# Patient Record
Sex: Female | Born: 1990 | Race: White | Hispanic: No | Marital: Single | Smoking: Never smoker
Health system: Southern US, Community
[De-identification: ages and names within clinical notes are randomized; demographics above are authoritative.]

---
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
## 2012-01-27 ENCOUNTER — Ambulatory Visit (INDEPENDENT_AMBULATORY_CARE_PROVIDER_SITE_OTHER): Payer: BC Managed Care – PPO | Admitting: Family Medicine

## 2012-01-27 ENCOUNTER — Encounter: Payer: Self-pay | Admitting: Family Medicine

## 2012-01-27 VITALS — BP 100/74 | HR 71 | Temp 98.7°F | Wt 147.0 lb

## 2012-01-27 DIAGNOSIS — F418 Other specified anxiety disorders: Secondary | ICD-10-CM

## 2012-01-27 DIAGNOSIS — F430 Acute stress reaction: Secondary | ICD-10-CM

## 2012-01-27 DIAGNOSIS — F341 Dysthymic disorder: Secondary | ICD-10-CM

## 2012-01-27 DIAGNOSIS — F988 Other specified behavioral and emotional disorders with onset usually occurring in childhood and adolescence: Secondary | ICD-10-CM

## 2012-01-27 DIAGNOSIS — F438 Other reactions to severe stress: Secondary | ICD-10-CM

## 2012-01-27 MED ORDER — ESCITALOPRAM OXALATE 10 MG PO TABS: 10.0000 mg | ORAL_TABLET | Freq: Every day | ORAL | Status: DC

## 2012-01-27 NOTE — Progress Notes (Signed)
  Subjective:    Patient ID: Krystal Hayden, female    DOB: Jul 12, 1990, 21 y.o.   MRN: [id number]  HPI Pt here c/o increased stress at school.      Review of Systems As above    Objective:   Physical Exam  Constitutional: She is oriented to person, place, and time. She appears well-developed and well-nourished.  Neurological: She is alert and oriented to person, place, and time.  Psychiatric: She has a normal mood and affect. Her behavior is normal. Judgment and thought content normal.       Add self report---? ADD Pt teary eyed with talking about stress and her roommate.  She is not suicidal          Assessment & Plan:

## 2012-01-27 NOTE — Assessment & Plan Note (Signed)
lexapro 10 mg  1 po qd Refer to [facility] rto 1 month or sooner prn

## 2012-01-27 NOTE — Patient Instructions (Signed)
Attention Deficit Hyperactivity Disorder Attention deficit hyperactivity disorder (ADHD) is a problem with behavior issues based on the way the brain functions (neurobehavioral disorder). It is a common reason for behavior and academic problems in school. CAUSES  The cause of ADHD is unknown in most cases. It may run in families. It sometimes can be associated with learning disabilities and other behavioral problems. SYMPTOMS  There are 3 types of ADHD. The 3 types and some of the symptoms include:  Inattentive   Gets bored or distracted easily.   Loses or forgets things. Forgets to hand in homework.   Has trouble organizing or completing tasks.   Difficulty staying on task.   An inability to organize daily tasks and school work.   Leaving projects, chores, or homework unfinished.   Trouble paying attention or responding to details. Careless mistakes.   Difficulty following directions. Often seems like is not listening.   Dislikes activities that require sustained attention (like chores or homework).   Hyperactive-impulsive   Feels like it is impossible to sit still or stay in a seat. Fidgeting with hands and feet.   Trouble waiting turn.   Talking too much or out of turn. Interruptive.   Speaks or acts impulsively.   Aggressive, disruptive behavior.   Constantly busy or on the go, noisy.   Combined   Has symptoms of both of the above.  Often children with ADHD feel discouraged about themselves and with school. They often perform well below their abilities in school. These symptoms can cause problems in home, school, and in relationships with peers. As children get older, the excess motor activities can calm down, but the problems with paying attention and staying organized persist. Most children do not outgrow ADHD but with good treatment can learn to cope with the symptoms. DIAGNOSIS  When ADHD is suspected, the diagnosis should be made by professionals trained in  ADHD.  Diagnosis will include:  Ruling out other reasons for the child's behavior.   The caregivers will check with the child's school and check their medical records.   They will talk to teachers and parents.   Behavior rating scales for the child will be filled out by those dealing with the child on a daily basis.  A diagnosis is made only after all information has been considered. TREATMENT  Treatment usually includes behavioral treatment often along with medicines. It may include stimulant medicines. The stimulant medicines decrease impulsivity and hyperactivity and increase attention. Other medicines used include antidepressants and certain blood pressure medicines. Most experts agree that treatment for ADHD should address all aspects of the child's functioning. Treatment should not be limited to the use of medicines alone. Treatment should include structured classroom management. The parents must receive education to address rewarding good behavior, discipline, and limit-setting. Tutoring or behavioral therapy or both should be available for the child. If untreated, the disorder can have long-term serious effects into adolescence and adulthood. HOME CARE INSTRUCTIONS   Often with ADHD there is a lot of frustration among the family in dealing with the illness. There is often blame and anger that is not warranted. This is a life long illness. There is no way to prevent ADHD. In many cases, because the problem affects the family as a whole, the entire family may need help. A therapist can help the family find better ways to handle the disruptive behaviors and promote change. If the child is young, most of the therapist's work is with the parents. Parents will   learn techniques for coping with and improving their child's behavior. Sometimes only the child with the ADHD needs counseling. Your caregivers can help you make these decisions.   Children with ADHD may need help in organizing. Some  helpful tips include:   Keep routines the same every day from wake-up time to bedtime. Schedule everything. This includes homework and playtime. This should include outdoor and indoor recreation. Keep the schedule on the refrigerator or a bulletin board where it is frequently seen. Mark schedule changes as far in advance as possible.   Have a place for everything and keep everything in its place. This includes clothing, backpacks, and school supplies.   Encourage writing down assignments and bringing home needed books.   Offer your child a well-balanced diet. Breakfast is especially important for school performance. Children should avoid drinks with caffeine including:   Soft drinks.   Coffee.   Tea.   However, some older children (adolescents) may find these drinks helpful in improving their attention.   Children with ADHD need consistent rules that they can understand and follow. If rules are followed, give small rewards. Children with ADHD often receive, and expect, criticism. Look for good behavior and praise it. Set realistic goals. Give clear instructions. Look for activities that can foster success and self-esteem. Make time for pleasant activities with your child. Give lots of affection.   Parents are their children's greatest advocates. Learn as much as possible about ADHD. This helps you become a stronger and better advocate for your child. It also helps you educate your child's teachers and instructors if they feel inadequate in these areas. Parent support groups are often helpful. A national group with local chapters is called CHADD (Children and Adults with Attention Deficit Hyperactivity Disorder).  PROGNOSIS  There is no cure for ADHD. Children with the disorder seldom outgrow it. Many find adaptive ways to accommodate the ADHD as they mature. SEEK MEDICAL CARE IF:  Your child has repeated muscle twitches, cough or speech outbursts.   Your child has sleep problems.   Your  child has a marked loss of appetite.   Your child develops depression.   Your child has new or worsening behavioral problems.   Your child develops dizziness.   Your child has a racing heart.   Your child has stomach pains.   Your child develops headaches.  Document Released: 04/05/2002 Document Revised: 04/04/2011 Document Reviewed: 11/16/2007 ExitCare Patient Information 2012 [employer]. 

## 2012-05-25 ENCOUNTER — Telehealth: Payer: Self-pay | Admitting: Family Medicine

## 2012-05-25 MED ORDER — LEVONORGEST-ETH ESTRAD 91-DAY 0.15-0.03 &0.01 MG PO TABS: 1.0000 | ORAL_TABLET | Freq: Every day | ORAL | Status: DC

## 2012-05-25 NOTE — Telephone Encounter (Signed)
Refill: Camrese 0.15-0.03-0.01 mg tab. Take 1 tablet by mouth daily. Qty 91. Last fill 02-24-12

## 2012-08-05 ENCOUNTER — Telehealth: Payer: Self-pay

## 2012-08-05 NOTE — Telephone Encounter (Signed)
Call to the patient, She needs Varicella titers to complete the forms for college. Msg left to call the office    KP

## 2012-08-10 NOTE — Telephone Encounter (Signed)
msg left to call the office     KP 
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
## 2012-08-15 ENCOUNTER — Other Ambulatory Visit: Payer: Self-pay | Admitting: Family Medicine

## 2012-08-25 NOTE — Telephone Encounter (Signed)
Letter mailed and copy of CPE form sent to be scanned and copy given back to Amy.     KP

## 2012-12-01 ENCOUNTER — Other Ambulatory Visit: Payer: Self-pay | Admitting: Family Medicine

## 2012-12-07 ENCOUNTER — Other Ambulatory Visit (HOSPITAL_COMMUNITY)
Admission: RE | Admit: 2012-12-07 | Discharge: 2012-12-07 | Disposition: A | Discharge status: Home / Self Care | Payer: BC Managed Care – PPO | Source: Ambulatory Visit | Attending: Family Medicine | Admitting: Family Medicine

## 2012-12-07 ENCOUNTER — Encounter: Payer: Self-pay | Admitting: Family Medicine

## 2012-12-07 ENCOUNTER — Ambulatory Visit (INDEPENDENT_AMBULATORY_CARE_PROVIDER_SITE_OTHER): Payer: BC Managed Care – PPO | Admitting: Family Medicine

## 2012-12-07 VITALS — BP 104/62 | HR 71 | Temp 98.8°F | Wt 146.8 lb

## 2012-12-07 DIAGNOSIS — Z124 Encounter for screening for malignant neoplasm of cervix: Secondary | ICD-10-CM | POA: Insufficient documentation

## 2012-12-07 DIAGNOSIS — N730 Acute parametritis and pelvic cellulitis: Secondary | ICD-10-CM

## 2012-12-07 DIAGNOSIS — Z113 Encounter for screening for infections with a predominantly sexual mode of transmission: Secondary | ICD-10-CM | POA: Insufficient documentation

## 2012-12-07 DIAGNOSIS — IMO0001 Reserved for inherently not codable concepts without codable children: Secondary | ICD-10-CM | POA: Insufficient documentation

## 2012-12-07 DIAGNOSIS — Z309 Encounter for contraceptive management, unspecified: Secondary | ICD-10-CM

## 2012-12-07 DIAGNOSIS — N760 Acute vaginitis: Secondary | ICD-10-CM | POA: Insufficient documentation

## 2012-12-07 DIAGNOSIS — Z1151 Encounter for screening for human papillomavirus (HPV): Secondary | ICD-10-CM | POA: Insufficient documentation

## 2012-12-07 MED ORDER — METRONIDAZOLE 500 MG PO TABS: ORAL_TABLET | ORAL | Status: DC

## 2012-12-07 MED ORDER — LEVONORGEST-ETH ESTRAD 91-DAY 0.15-0.03 &0.01 MG PO TABS: ORAL_TABLET | ORAL | Status: AC

## 2012-12-07 MED ORDER — AZITHROMYCIN 250 MG PO TABS: ORAL_TABLET | ORAL | Status: DC

## 2012-12-07 MED ORDER — CEFTRIAXONE SODIUM 500 MG IJ SOLR
500.0000 mg | Freq: Once | INTRAMUSCULAR | Status: AC
  Administered 2012-12-07: 500 mg via INTRAMUSCULAR

## 2012-12-07 NOTE — Patient Instructions (Signed)
Pelvic Inflammatory Disease °Pelvic inflammatory disease (PID) refers to an infection in some or all of the female organs. The infection can be in the uterus, ovaries, fallopian tubes, or the surrounding tissues in the pelvis. PID can cause abdominal or pelvic pain that comes on suddenly (acute pelvic pain). PID is a serious infection because it can lead to lasting (chronic) pelvic pain or the inability to have children (infertile).  °CAUSES  °The infection is often caused by the normal bacteria found in the vaginal tissues. PID may also be caused by an infection that is spread during sexual contact. PID can also occur following:  °· The birth of a baby.   °· A miscarriage.   °· An abortion.   °· Major pelvic surgery.   °· The use of an intrauterine device (IUD).   °· A sexual assault.   °RISK FACTORS °Certain factors can put a person at higher risk for PID, such as: °· Being younger than 25 years. °· Being sexually active at a young age. °· Using nonbarrier contraception. °· Having multiple sexual partners. °· Having sex with someone who has symptoms of a genital infection. °· Using oral contraception. °Other times, certain behaviors can increase the possibility of getting PID, such as: °· Having sex during your period. °· Using a vaginal douche. °· Having an intrauterine device (IUD) in place. °SYMPTOMS  °· Abdominal or pelvic pain.   °· Fever.   °· Chills.   °· Abnormal vaginal discharge. °· Abnormal uterine bleeding.   °· Unusual pain shortly after finishing your period. °DIAGNOSIS  °Your caregiver will choose some of the following methods to make a diagnosis, such as:  °· Performing a physical exam and history. A pelvic exam typically reveals a very tender uterus and surrounding pelvis.   °· Ordering laboratory tests including a pregnancy test, blood tests, and urine test.  °· Ordering cultures of the vagina and cervix to check for a sexually transmitted infection (STI). °· Performing an ultrasound.    °· Performing a laparoscopic procedure to look inside the pelvis.   °TREATMENT  °· Antibiotic medicines may be prescribed and taken by mouth.   °· Sexual partners may be treated when the infection is caused by a sexually transmitted disease (STD).   °· Hospitalization may be needed to give antibiotics intravenously. °· Surgery may be needed, but this is rare. °It may take weeks until you are completely well. If you are diagnosed with PID, you should also be checked for human immunodeficiency virus (HIV).   °HOME CARE INSTRUCTIONS  °· If given, take your antibiotics as directed. Finish the medicine even if you start to feel better.   °· Only take over-the-counter or prescription medicines for pain, discomfort, or fever as directed by your caregiver.   °· Do not have sexual intercourse until treatment is completed or as directed by your caregiver. If PID is confirmed, your recent sexual partner(s) will need treatment.   °· Keep your follow-up appointments. °SEEK MEDICAL CARE IF:  °· You have increased or abnormal vaginal discharge.   °· You need prescription medicine for your pain.   °· You vomit.   °· You cannot take your medicines.   °· Your partner has an STD.   °SEEK IMMEDIATE MEDICAL CARE IF:  °· You have a fever.   °· You have increased abdominal or pelvic pain.   °· You have chills.   °· You have pain when you urinate.   °· You are not better after 72 hours following treatment.   °MAKE SURE YOU:  °· Understand these instructions. °· Will watch your condition. °· Will get help right away if you are not doing well or get worse. °  pelvic pain.    You have chills.    You have pain when you urinate.    You are not better after 72 hours following treatment.   MAKE SURE YOU:    Understand these instructions.   Will watch your condition.   Will get help right away if you are not doing well or get worse.  Document Released: 04/15/2005 Document Revised: 01/08/2012 Document Reviewed: 04/11/2011  ExitCare Patient Information 2014 [employer].

## 2012-12-07 NOTE — Assessment & Plan Note (Signed)
Rocephin zithromax  metrogel Cultures pending Check UA and Preg-- neg preg Pap done

## 2012-12-07 NOTE — Assessment & Plan Note (Addendum)
Refill bcp

## 2012-12-07 NOTE — Progress Notes (Signed)
  Subjective:    Patient ID: Krystal Hayden, female    DOB: 05/12/1990, 22 y.o.   MRN: [id number]  HPI Pt here c/o painful intercourse and vaginal d/c.  No dysuria.   Review of Systems As above    Objective:   Physical Exam  BP 104/62  Pulse 71  Temp(Src) 98.8 F (37.1 C) (Oral)  Wt 146 lb 12.8 oz (66.588 kg)  BMI 21.06 kg/m2  SpO2 98% General appearance: alert, cooperative, appears stated age and no distress Neck: no adenopathy, no carotid bruit, no JVD, supple, symmetrical, trachea midline and thyroid not enlarged, symmetric, no tenderness/mass/nodules Lungs: clear to auscultation bilaterally Heart: S1, S2 normal Abdomen: soft, non-tender; bowel sounds normal; no masses,  no organomegaly Pelvic: positive findings: positive chandelier sign, tender uterus or vaginal discharge:  copious, white and thin Extremities: extremities normal, atraumatic, no cyanosis or edema      Assessment & Plan:

## 2012-12-08 ENCOUNTER — Telehealth: Payer: Self-pay | Admitting: Family Medicine

## 2012-12-08 LAB — POCT URINALYSIS DIPSTICK
Bilirubin, UA: NEGATIVE
Glucose, UA: NEGATIVE
Ketones, UA: NEGATIVE
Spec Grav, UA: >=1.03

## 2012-12-08 LAB — BASIC METABOLIC PANEL
CO2: 24 mEq/L (ref 19–32)
Calcium: 9.2 mg/dL (ref 8.4–10.5)
Creatinine, Ser: 0.8 mg/dL (ref 0.4–1.2)

## 2012-12-08 LAB — HEPATIC FUNCTION PANEL
Bilirubin, Direct: 0.1 mg/dL (ref 0.0–0.3)
Total Bilirubin: 0.4 mg/dL (ref 0.3–1.2)

## 2012-12-08 LAB — CBC WITH DIFFERENTIAL/PLATELET
Basophils Absolute: 0.1 10*3/uL (ref 0.0–0.1)
Basophils Relative: 1.8 % (ref 0.0–3.0)
Eosinophils Absolute: 0.1 10*3/uL (ref 0.0–0.7)
Lymphocytes Relative: 28.8 % (ref 12.0–46.0)
MCHC: 33.3 g/dL (ref 30.0–36.0)
Neutrophils Relative %: 60.5 % (ref 43.0–77.0)
RBC: 4.34 Mil/uL (ref 3.87–5.11)
RDW: 12.5 % (ref 11.5–14.6)

## 2012-12-08 LAB — RPR

## 2012-12-08 LAB — POCT URINE PREGNANCY: Preg Test, Ur: NEGATIVE

## 2012-12-08 NOTE — Telephone Encounter (Signed)
Pt called regarding her lab results. She also wanted Dr. Laury Axon to know that she found out she has been exposed to chlamydia.

## 2012-12-09 NOTE — Telephone Encounter (Signed)
noted 

## 2012-12-09 NOTE — Telephone Encounter (Signed)
Spoke with patient and advised of lab results. Patient upset, gave encouragement and advised to call if she had any other questions that we could answer for her. Patient will look at the website and let us know if she has quesitons. Advised that the pap would be back later in the week.

## 2012-12-10 ENCOUNTER — Other Ambulatory Visit: Payer: BC Managed Care – PPO

## 2012-12-14 ENCOUNTER — Telehealth: Payer: Self-pay

## 2012-12-14 NOTE — Telephone Encounter (Signed)
msg left to call the office     KP 

## 2012-12-14 NOTE — Telephone Encounter (Signed)
Message copied by Arnette Norris on Mon Dec 14, 2012  9:53 AM ------      Message from: Lelon Perla      Created: Fri Dec 11, 2012 10:59 AM       + BV on pap--- pt being treated ---definitely finish flagyl ------

## 2012-12-14 NOTE — Telephone Encounter (Signed)
Patient aware and she voiced understanding.      KP

## 2012-12-14 NOTE — Telephone Encounter (Signed)
Patient is returning missed call.

## 2013-04-16 ENCOUNTER — Other Ambulatory Visit: Payer: Self-pay | Admitting: Family Medicine

## 2013-05-04 ENCOUNTER — Telehealth: Payer: Self-pay | Admitting: *Deleted

## 2013-05-04 NOTE — Telephone Encounter (Signed)
05/03/2013 Pt dropped off Immunization Documentation Form for [employer].  Please fax completed form to number on bottom of form. Attached billing form and put in Sandra's folder.  bw

## 2014-02-19 ENCOUNTER — Emergency Department (HOSPITAL_BASED_OUTPATIENT_CLINIC_OR_DEPARTMENT_OTHER)
Admission: EM | Admit: 2014-02-19 | Discharge: 2014-02-19 | Disposition: A | Discharge status: Home / Self Care | Payer: BC Managed Care – PPO | Attending: Emergency Medicine | Admitting: Emergency Medicine

## 2014-02-19 ENCOUNTER — Encounter (HOSPITAL_BASED_OUTPATIENT_CLINIC_OR_DEPARTMENT_OTHER): Payer: Self-pay | Admitting: Emergency Medicine

## 2014-02-19 DIAGNOSIS — Z79818 Long term (current) use of other agents affecting estrogen receptors and estrogen levels: Secondary | ICD-10-CM | POA: Diagnosis not present

## 2014-02-19 DIAGNOSIS — L03313 Cellulitis of chest wall: Secondary | ICD-10-CM | POA: Diagnosis not present

## 2014-02-19 DIAGNOSIS — R61 Generalized hyperhidrosis: Secondary | ICD-10-CM | POA: Diagnosis not present

## 2014-02-19 DIAGNOSIS — J45909 Unspecified asthma, uncomplicated: Secondary | ICD-10-CM | POA: Insufficient documentation

## 2014-02-19 DIAGNOSIS — N390 Urinary tract infection, site not specified: Secondary | ICD-10-CM | POA: Insufficient documentation

## 2014-02-19 DIAGNOSIS — R509 Fever, unspecified: Secondary | ICD-10-CM | POA: Diagnosis present

## 2014-02-19 DIAGNOSIS — Z3202 Encounter for pregnancy test, result negative: Secondary | ICD-10-CM | POA: Diagnosis not present

## 2014-02-19 LAB — URINE MICROSCOPIC-ADD ON

## 2014-02-19 LAB — BASIC METABOLIC PANEL
Anion gap: 15 (ref 5–15)
BUN: 10 mg/dL (ref 6–23)
CHLORIDE: 100 meq/L (ref 96–112)
CO2: 23 mEq/L (ref 19–32)
CREATININE: 1 mg/dL (ref 0.50–1.10)
Calcium: 9.8 mg/dL (ref 8.4–10.5)
GFR calc non Af Amer: 80 mL/min — ABNORMAL LOW (ref >90)
Glucose, Bld: 136 mg/dL — ABNORMAL HIGH (ref 70–99)
POTASSIUM: 3.7 meq/L (ref 3.7–5.3)
Sodium: 138 mEq/L (ref 137–147)

## 2014-02-19 LAB — CBC WITH DIFFERENTIAL/PLATELET
BASOS PCT: 0 % (ref 0–1)
Basophils Absolute: 0 10*3/uL (ref 0.0–0.1)
EOS ABS: 0 10*3/uL (ref 0.0–0.7)
EOS PCT: 0 % (ref 0–5)
HCT: 40.4 % (ref 36.0–46.0)
HEMOGLOBIN: 14.1 g/dL (ref 12.0–15.0)
LYMPHS ABS: 1.6 10*3/uL (ref 0.7–4.0)
Lymphocytes Relative: 11 % — ABNORMAL LOW (ref 12–46)
MCH: 31.7 pg (ref 26.0–34.0)
MCHC: 34.9 g/dL (ref 30.0–36.0)
MCV: 90.8 fL (ref 78.0–100.0)
MONO ABS: 1.5 10*3/uL — AB (ref 0.1–1.0)
MONOS PCT: 11 % (ref 3–12)
Neutro Abs: 10.8 10*3/uL — ABNORMAL HIGH (ref 1.7–7.7)
Neutrophils Relative %: 78 % — ABNORMAL HIGH (ref 43–77)
Platelets: 232 10*3/uL (ref 150–400)
RBC: 4.45 MIL/uL (ref 3.87–5.11)
RDW: 12.1 % (ref 11.5–15.5)
WBC: 13.9 10*3/uL — ABNORMAL HIGH (ref 4.0–10.5)

## 2014-02-19 LAB — URINALYSIS, ROUTINE W REFLEX MICROSCOPIC
GLUCOSE, UA: NEGATIVE mg/dL
HGB URINE DIPSTICK: NEGATIVE
KETONES UR: 15 mg/dL — AB
Nitrite: NEGATIVE
PROTEIN: 100 mg/dL — AB
Specific Gravity, Urine: 1.039 — ABNORMAL HIGH (ref 1.005–1.030)
UROBILINOGEN UA: 1 mg/dL (ref 0.0–1.0)
pH: 6 (ref 5.0–8.0)

## 2014-02-19 LAB — PREGNANCY, URINE: Preg Test, Ur: NEGATIVE

## 2014-02-19 MED ORDER — DEXTROSE 5 % IV SOLN
1.0000 g | Freq: Once | INTRAVENOUS | Status: AC
  Administered 2014-02-19: 1 g via INTRAVENOUS

## 2014-02-19 MED ORDER — CEFTRIAXONE SODIUM 1 G IJ SOLR
INTRAMUSCULAR | Status: AC
  Filled 2014-02-19: qty 10

## 2014-02-19 MED ORDER — CEPHALEXIN 500 MG PO CAPS: 500.0000 mg | ORAL_CAPSULE | Freq: Four times a day (QID) | ORAL | Status: AC

## 2014-02-19 MED ORDER — ACETAMINOPHEN 325 MG PO TABS
650.0000 mg | ORAL_TABLET | Freq: Once | ORAL | Status: AC
  Administered 2014-02-19: 650 mg via ORAL
  Filled 2014-02-19: qty 2

## 2014-02-19 NOTE — ED Provider Notes (Addendum)
CSN: [id number]     Arrival date & time 02/19/14  1216 History   First MD Initiated Contact with Patient 02/19/14 1223     Chief Complaint  Patient presents with  . Fever      HPI  Pt presents with urinary frequency, and a concern about redness to breast.  H/O BAM/breast implants. Also Nipple rings for last month. No fever chills, NVD. Mild dysuria, without hematuria.   Past Medical History  Diagnosis Date  . Asthma    History reviewed. No pertinent past surgical history. No family history on file. History  Substance Use Topics  . Smoking status: Never Smoker   . Smokeless tobacco: Not on file  . Alcohol Use: No   OB History   Grav Para Term Preterm Abortions TAB SAB Ect Mult Living                 Review of Systems  Constitutional: Positive for fever, chills and diaphoresis. Negative for appetite change and fatigue.  HENT: Negative for mouth sores, sore throat and trouble swallowing.   Eyes: Negative for visual disturbance.  Respiratory: Negative for cough, chest tightness, shortness of breath and wheezing.   Cardiovascular: Negative for chest pain.  Gastrointestinal: Negative for nausea, vomiting, abdominal pain, diarrhea and abdominal distention.  Endocrine: Negative for polydipsia, polyphagia and polyuria.  Genitourinary: Positive for urgency. Negative for dysuria, frequency and hematuria.  Musculoskeletal: Negative for gait problem.  Skin: Positive for color change and wound. Negative for pallor and rash.       Redness to the skin of the right breast.  Neurological: Negative for dizziness, syncope, light-headedness and headaches.  Hematological: Does not bruise/bleed easily.  Psychiatric/Behavioral: Negative for behavioral problems and confusion.      Allergies  Review of patient's allergies indicates no known allergies.  Home Medications   Prior to Admission medications   Medication Sig Start Date End Date Taking? Authorizing Provider  cephALEXin  (KEFLEX) 500 MG capsule Take 1 capsule (500 mg total) by mouth 4 (four) times daily. 02/19/14 02/26/14  Rolland PorterMark Matteson Blue, MD  Levonorgestrel-Ethinyl Estradiol (CAMRESE) 0.15-0.03 &0.01 MG tablet TAKE 1 TABLET BY MOUTH DAILY. 12/07/12   Grayling CongressYvonne R Lowne, DO   BP 98/65  Pulse 93  Temp(Src) 97.2 F (36.2 C) (Oral)  Resp 18  Ht 5\' 10"  (1.778 m)  Wt 150 lb (68.04 kg)  BMI 21.52 kg/m2  SpO2 98% Physical Exam  Constitutional: She is oriented to person, place, and time. She appears well-developed and well-nourished. No distress.  HENT:  Head: Normocephalic.  Eyes: Conjunctivae are normal. Pupils are equal, round, and reactive to light. No scleral icterus.  Neck: Normal range of motion. Neck supple. No thyromegaly present.  Cardiovascular: Normal rate and regular rhythm.  Exam reveals no gallop and no friction rub.   No murmur heard. Pulmonary/Chest: Effort normal and breath sounds normal. No respiratory distress. She has no wheezes. She has no rales.    Abdominal: Soft. Bowel sounds are normal. She exhibits no distension. There is no tenderness. There is no rebound.  Musculoskeletal: Normal range of motion.  Neurological: She is alert and oriented to person, place, and time.  Skin: Skin is warm and dry. No rash noted.  Psychiatric: She has a normal mood and affect. Her behavior is normal.    ED Course  Procedures (including critical care time) Labs Review Labs Reviewed  BASIC METABOLIC PANEL - Abnormal; Notable for the following:    Glucose, Bld 136 (*)  GFR calc non Af Amer 80 (*)    All other components within normal limits  CBC WITH DIFFERENTIAL - Abnormal; Notable for the following:    WBC 13.9 (*)    Neutrophils Relative % 78 (*)    Neutro Abs 10.8 (*)    Lymphocytes Relative 11 (*)    Monocytes Absolute 1.5 (*)    All other components within normal limits  URINALYSIS, ROUTINE W REFLEX MICROSCOPIC - Abnormal; Notable for the following:    Color, Urine Krystal Hayden (*)    APPearance  CLOUDY (*)    Specific Gravity, Urine 1.039 (*)    Bilirubin Urine SMALL (*)    Ketones, ur 15 (*)    Protein, ur 100 (*)    Leukocytes, UA SMALL (*)    All other components within normal limits  URINE MICROSCOPIC-ADD ON - Abnormal; Notable for the following:    Squamous Epithelial / LPF FEW (*)    Bacteria, UA MANY (*)    All other components within normal limits  PREGNANCY, URINE    Imaging Review No results found.   EKG Interpretation None      MDM   Final diagnoses:  Cellulitis of chest wall  UTI (lower urinary tract infection)    Patient states that she feels like she has to her to the bathroom or" I will not  hold my urine". Bit denies dysuria frequency. Denies symptoms that felt like past episodes of" UTI or STD".    Has pyuria. Culture pending. Given IV Rocephin. Next appropriate for outpatient treatment. KEFLEX. Primary care follow-up. Recheck with any acute changes.    Rolland PorterMark Kendon Sedeno, MD 02/19/14 1455  Rolland PorterMark Gasper Hopes, MD 03/20/14 [phone]

## 2014-02-19 NOTE — Discharge Instructions (Signed)
Cellulitis Cellulitis is an infection of the skin and the tissue under the skin. The infected area is usually red and tender. This happens most often in the arms and lower legs. HOME CARE   Take your antibiotic medicine as told. Finish the medicine even if you start to feel better.  Keep the infected arm or leg raised (elevated).  Put a warm cloth on the area up to 4 times per day.  Only take medicines as told by your doctor.  Keep all doctor visits as told. GET HELP IF:  You see red streaks on the skin coming from the infected area.  Your red area gets bigger or turns a dark color.  Your bone or joint under the infected area is painful after the skin heals.  Your infection comes back in the same area or different area.  You have a puffy (swollen) bump in the infected area.  You have new symptoms.  You have a fever. GET HELP RIGHT AWAY IF:   You feel very sleepy.  You throw up (vomit) or have watery poop (diarrhea).  You feel sick and have muscle aches and pains. MAKE SURE YOU:   Understand these instructions.  Will watch your condition.  Will get help right away if you are not doing well or get worse. Document Released: 10/02/2007 Document Revised: 08/30/2013 Document Reviewed: 07/01/2011 [facility] Patient Information 2015 [place], [place]. This information is not intended to replace advice given to you by your health care provider. Make sure you discuss any questions you have with your health care provider.  Urinary Tract Infection Urinary tract infections (UTIs) can develop anywhere along your urinary tract. Your urinary tract is your body's drainage system for removing wastes and extra water. Your urinary tract includes two kidneys, two ureters, a bladder, and a urethra. Your kidneys are a pair of bean-shaped organs. Each kidney is about the size of your fist. They are located below your ribs, one on each side of your spine. CAUSES Infections are caused by microbes,  which are microscopic organisms, including fungi, viruses, and bacteria. These organisms are so small that they can only be seen through a microscope. Bacteria are the microbes that most commonly cause UTIs. SYMPTOMS  Symptoms of UTIs may vary by age and gender of the patient and by the location of the infection. Symptoms in young women typically include a frequent and intense urge to urinate and a painful, burning feeling in the bladder or urethra during urination. Older women and men are more likely to be tired, shaky, and weak and have muscle aches and abdominal pain. A fever may mean the infection is in your kidneys. Other symptoms of a kidney infection include pain in your back or sides below the ribs, nausea, and vomiting. DIAGNOSIS To diagnose a UTI, your caregiver will ask you about your symptoms. Your caregiver also will ask to provide a urine sample. The urine sample will be tested for bacteria and white blood cells. White blood cells are made by your body to help fight infection. TREATMENT  Typically, UTIs can be treated with medication. Because most UTIs are caused by a bacterial infection, they usually can be treated with the use of antibiotics. The choice of antibiotic and length of treatment depend on your symptoms and the type of bacteria causing your infection. HOME CARE INSTRUCTIONS  If you were prescribed antibiotics, take them exactly as your caregiver instructs you. Finish the medication even if you feel better after you have only taken  some of the medication.  Drink enough water and fluids to keep your urine clear or pale yellow.  Avoid caffeine, tea, and carbonated beverages. They tend to irritate your bladder.  Empty your bladder often. Avoid holding urine for long periods of time.  Empty your bladder before and after sexual intercourse.  After a bowel movement, women should cleanse from front to back. Use each tissue only once. SEEK MEDICAL CARE IF:   You have back  pain.  You develop a fever.  Your symptoms do not begin to resolve within 3 days. SEEK IMMEDIATE MEDICAL CARE IF:   You have severe back pain or lower abdominal pain.  You develop chills.  You have nausea or vomiting.  You have continued burning or discomfort with urination. MAKE SURE YOU:   Understand these instructions.  Will watch your condition.  Will get help right away if you are not doing well or get worse. Document Released: 01/23/2005 Document Revised: 10/15/2011 Document Reviewed: 05/24/2011 [facility] Patient Information 2015 [place], [place]. This information is not intended to replace advice given to you by your health care provider. Make sure you discuss any questions you have with your health care provider.

## 2014-02-19 NOTE — ED Notes (Signed)
Patient here with breast implants 2 months ago and had nipple ring placed 2 weeks ago and now having fever, and redness with warmth to right breast.
